# Patient Record
Sex: Female | Born: 1965 | Race: White | Hispanic: No
Health system: Southern US, Community
[De-identification: ages and names within clinical notes are randomized; demographics above are authoritative.]

---

## 2010-11-14 ENCOUNTER — Other Ambulatory Visit (HOSPITAL_COMMUNITY): Payer: Self-pay | Admitting: Endocrinology

## 2010-11-14 DIAGNOSIS — C73 Malignant neoplasm of thyroid gland: Secondary | ICD-10-CM

## 2010-11-18 ENCOUNTER — Encounter (HOSPITAL_COMMUNITY)
Admission: RE | Admit: 2010-11-18 | Discharge: 2010-11-18 | Disposition: A | Discharge status: Home / Self Care | Payer: BC Managed Care – PPO | Source: Ambulatory Visit | Attending: Endocrinology | Admitting: Endocrinology

## 2010-11-18 DIAGNOSIS — C73 Malignant neoplasm of thyroid gland: Secondary | ICD-10-CM | POA: Insufficient documentation

## 2010-11-19 ENCOUNTER — Encounter (HOSPITAL_COMMUNITY)
Admission: RE | Admit: 2010-11-19 | Discharge: 2010-11-19 | Disposition: A | Discharge status: Home / Self Care | Payer: BC Managed Care – PPO | Source: Ambulatory Visit | Attending: Endocrinology | Admitting: Endocrinology

## 2010-11-19 DIAGNOSIS — Z09 Encounter for follow-up examination after completed treatment for conditions other than malignant neoplasm: Secondary | ICD-10-CM | POA: Insufficient documentation

## 2010-11-19 DIAGNOSIS — C73 Malignant neoplasm of thyroid gland: Secondary | ICD-10-CM | POA: Insufficient documentation

## 2010-11-19 DIAGNOSIS — E0789 Other specified disorders of thyroid: Secondary | ICD-10-CM | POA: Insufficient documentation

## 2010-11-20 ENCOUNTER — Encounter (HOSPITAL_COMMUNITY)
Admission: RE | Admit: 2010-11-20 | Discharge: 2010-11-20 | Disposition: A | Discharge status: Home / Self Care | Payer: BC Managed Care – PPO | Source: Ambulatory Visit | Attending: Endocrinology | Admitting: Endocrinology

## 2010-11-20 LAB — HCG, SERUM, QUALITATIVE: Preg, Serum: NEGATIVE

## 2010-11-22 ENCOUNTER — Encounter (HOSPITAL_COMMUNITY)
Admission: RE | Admit: 2010-11-22 | Discharge: 2010-11-22 | Disposition: A | Discharge status: Home / Self Care | Payer: BC Managed Care – PPO | Source: Ambulatory Visit | Attending: Endocrinology | Admitting: Endocrinology

## 2010-11-22 DIAGNOSIS — C73 Malignant neoplasm of thyroid gland: Secondary | ICD-10-CM | POA: Insufficient documentation

## 2010-11-22 DIAGNOSIS — Z09 Encounter for follow-up examination after completed treatment for conditions other than malignant neoplasm: Secondary | ICD-10-CM | POA: Insufficient documentation

## 2010-11-22 MED ORDER — SODIUM IODIDE I 131 CAPSULE
4.0000 | Freq: Once | INTRAVENOUS | Status: AC | PRN
  Administered 2010-11-22: 4 via ORAL

## 2010-11-25 LAB — THYROGLOBULIN LEVEL: Thyroglobulin: <0.2 ng/mL (ref 0.0–55.0)

## 2010-11-25 LAB — THYROGLOBULIN ANTIBODY: Thyroglobulin Ab: <20 U/mL (ref <40.0)

## 2013-12-09 ENCOUNTER — Other Ambulatory Visit (HOSPITAL_COMMUNITY): Payer: Self-pay | Admitting: Endocrinology

## 2013-12-09 DIAGNOSIS — C73 Malignant neoplasm of thyroid gland: Secondary | ICD-10-CM

## 2014-01-02 ENCOUNTER — Encounter (HOSPITAL_COMMUNITY)
Admission: RE | Admit: 2014-01-02 | Discharge: 2014-01-02 | Disposition: A | Discharge status: Home / Self Care | Payer: Self-pay | Source: Ambulatory Visit | Attending: Endocrinology | Admitting: Endocrinology

## 2014-01-02 DIAGNOSIS — C73 Malignant neoplasm of thyroid gland: Secondary | ICD-10-CM | POA: Insufficient documentation

## 2014-01-02 MED ORDER — THYROTROPIN ALFA 1.1 MG IM SOLR
0.9000 mg | INTRAMUSCULAR | Status: AC
  Administered 2014-01-02: 0.9 mg via INTRAMUSCULAR

## 2014-01-03 ENCOUNTER — Encounter (HOSPITAL_COMMUNITY)
Admission: RE | Admit: 2014-01-03 | Discharge: 2014-01-03 | Disposition: A | Discharge status: Home / Self Care | Payer: BC Managed Care – PPO | Source: Ambulatory Visit | Attending: Endocrinology | Admitting: Endocrinology

## 2014-01-03 MED ORDER — THYROTROPIN ALFA 1.1 MG IM SOLR
0.9000 mg | INTRAMUSCULAR | Status: AC
  Administered 2014-01-03: 0.9 mg via INTRAMUSCULAR

## 2014-01-04 ENCOUNTER — Encounter (HOSPITAL_COMMUNITY)
Admission: RE | Admit: 2014-01-04 | Discharge: 2014-01-04 | Disposition: A | Discharge status: Home / Self Care | Payer: BC Managed Care – PPO | Source: Ambulatory Visit | Attending: Endocrinology | Admitting: Endocrinology

## 2014-01-04 LAB — HCG, SERUM, QUALITATIVE: Preg, Serum: NEGATIVE

## 2014-01-04 MED ORDER — SODIUM IODIDE I 131 CAPSULE
4.0000 | Freq: Once | INTRAVENOUS | Status: AC | PRN
  Administered 2014-01-04: 4 via ORAL

## 2014-01-06 ENCOUNTER — Encounter (HOSPITAL_COMMUNITY)
Admission: RE | Admit: 2014-01-06 | Discharge: 2014-01-06 | Disposition: A | Discharge status: Home / Self Care | Payer: Self-pay | Source: Ambulatory Visit | Attending: Endocrinology | Admitting: Endocrinology

## 2014-01-06 DIAGNOSIS — C73 Malignant neoplasm of thyroid gland: Secondary | ICD-10-CM | POA: Insufficient documentation

## 2014-01-07 LAB — THYROGLOBULIN ANTIBODY: Thyroglobulin Ab: <1 IU/mL (ref <2)

## 2014-01-07 LAB — THYROGLOBULIN LEVEL: Thyroglobulin: 0.2 ng/mL — ABNORMAL LOW (ref 2.8–40.9)

## 2015-09-15 IMAGING — NM NM [ID] THYROID CANCER METS WHOLE BODY W/ THYROGEN
5 series · 5 of 5 positions shown · non-contrast
Comparison: 11/22/2010.

CLINICAL DATA: History of thyroid cancer.

EXAM:
THYROGEN-STIMULATED K-[id number] WHOLE BODY SCAN
TECHNIQUE: The patient received 0.9 mg Thyrogen intramuscularly every 24 hours
for two doses. On the third day the patient returned and received
the radiopharmaceutical, per orally. On the fifth day, the patient
returned and whole body planar images were obtained in the anterior
and posterior projections.
RADIOPHARMACEUTICALS:  4.0 mIiC-ILI sodium iodide

[Series 1: marker · 4.14mm/px · 1 of 1 slices shown (1 of 2)]
[im 1/1]
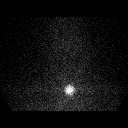

[Series 1: marker · 4.14mm/px · 1 of 1 slices shown (2 of 2)]
[im 1/1]
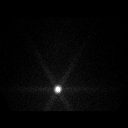

[Series 2: static thyroid no marker · 4.14mm/px · 1 of 1 slices shown]
[im 1/1  full-range]
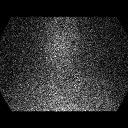

[Series 3: i131 whole body · 2.66mm/px · 1 of 1 slices shown (1 of 2)]
[im 1/1  full-range]
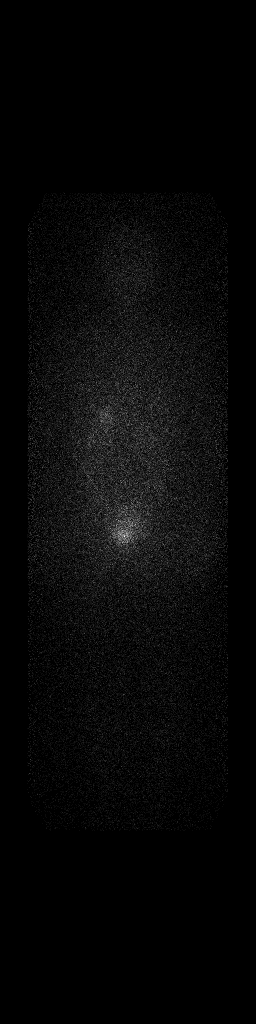

[Series 3: i131 whole body · 2.66mm/px · 1 of 1 slices shown (2 of 2)]
[im 1/1  full-range]
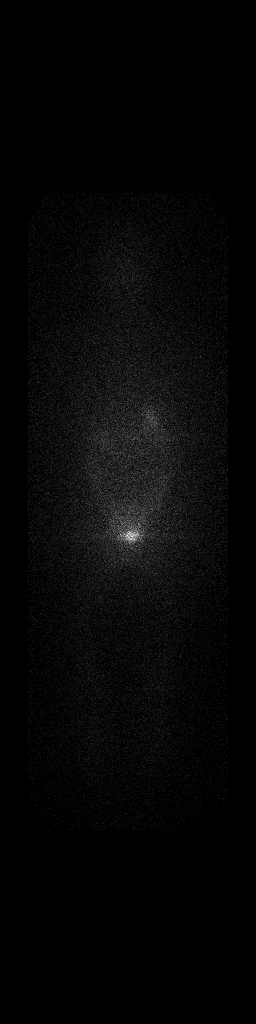

[5 of 5 positions shown; findings below may reference images not displayed]

FINDINGS: There is no evidence for residual or recurrent functioning thyroid
tissue within the thyroid bed. No evidence for distant metastatic
disease.
IMPRESSION: No evidence of recurrent or metastatic thyroid cancer.
